# Patient Record
Sex: Male | Born: 1980
Health system: Midwestern US, Community
[De-identification: ages and names within clinical notes are randomized; demographics above are authoritative.]

---

## 2000-11-23 ENCOUNTER — Emergency Department (HOSPITAL_COMMUNITY): Admission: EM | Admit: 2000-11-23 | Discharge: 2000-11-23 | Payer: Self-pay | Admitting: Emergency Medicine

## 2000-11-23 ENCOUNTER — Encounter: Payer: Self-pay | Admitting: Emergency Medicine

## 2003-06-22 ENCOUNTER — Encounter: Admission: RE | Admit: 2003-06-22 | Discharge: 2003-06-22 | Payer: Self-pay | Admitting: Otolaryngology

## 2003-06-22 ENCOUNTER — Encounter: Payer: Self-pay | Admitting: Otolaryngology

## 2004-08-08 ENCOUNTER — Emergency Department (HOSPITAL_COMMUNITY): Admission: EM | Admit: 2004-08-08 | Discharge: 2004-08-09 | Payer: Self-pay | Admitting: Emergency Medicine

## 2005-07-09 ENCOUNTER — Emergency Department (HOSPITAL_COMMUNITY): Admission: EM | Admit: 2005-07-09 | Discharge: 2005-07-09 | Payer: Self-pay | Admitting: Emergency Medicine

## 2005-12-31 ENCOUNTER — Emergency Department (HOSPITAL_COMMUNITY): Admission: EM | Admit: 2005-12-31 | Discharge: 2005-12-31 | Payer: Self-pay | Admitting: Emergency Medicine

## 2006-11-14 IMAGING — CT CT ABDOMEN W/O CM
2 of 5 series · 17 of 46 positions shown, 19 images · IV contrast (agent unspecified)
Comparison: none

CLINICAL DATA: Back pain.
ABDOMEN CT WITHOUT CONTRAST:
TECHNIQUE: Multidetector CT imaging of the abdomen was performed following the standard protocol without IV contrast.
TECHNIQUE: Multidetector CT imaging of the pelvis was performed following the standard protocol without IV contrast.

[Series 4: abd_pel 2.0 b40f st · axial · 0.69mm/px · z∈[-443,-32]mm · 14 of 635 slices shown, 16 images]
[im 24/635  soft-tissue]
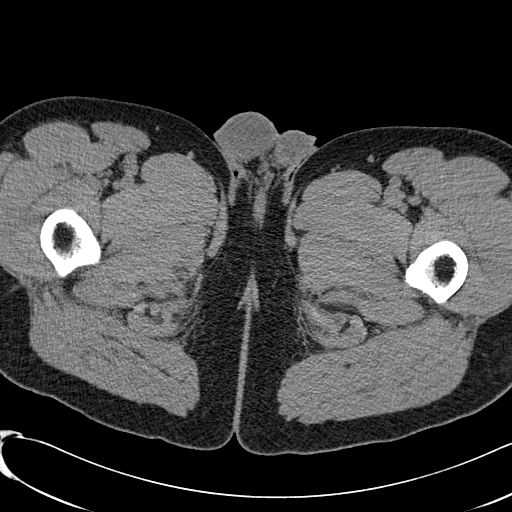
[im 24/635  bone]
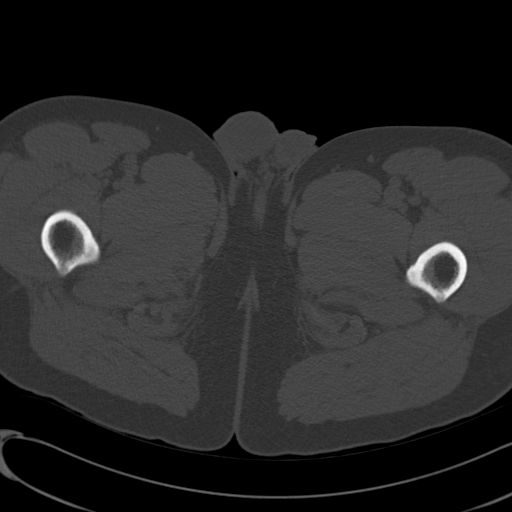
[im 71/635  soft-tissue]
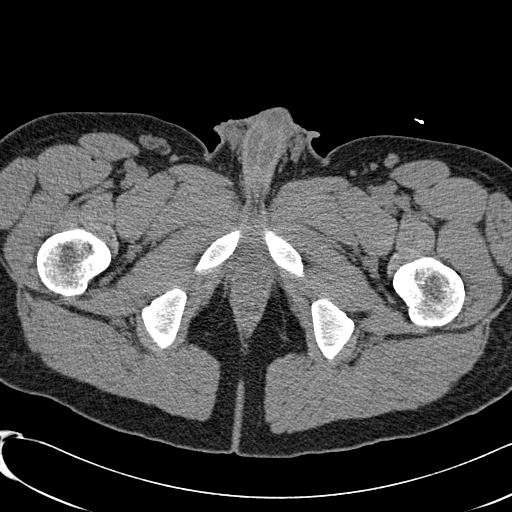
[im 118/635  soft-tissue]
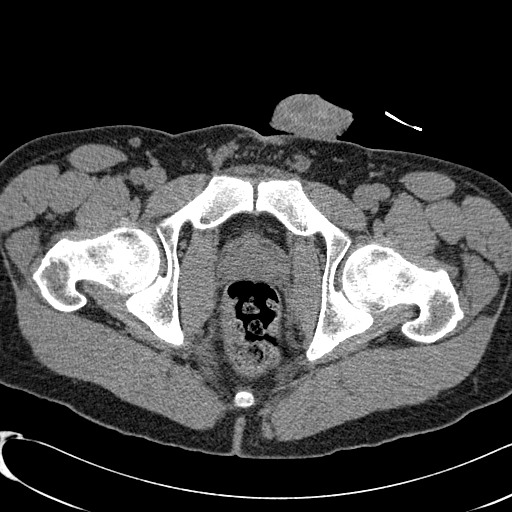
[im 165/635  soft-tissue]
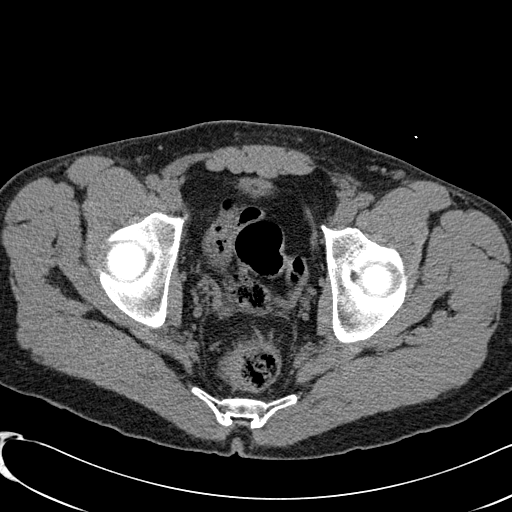
[im 212/635  soft-tissue]
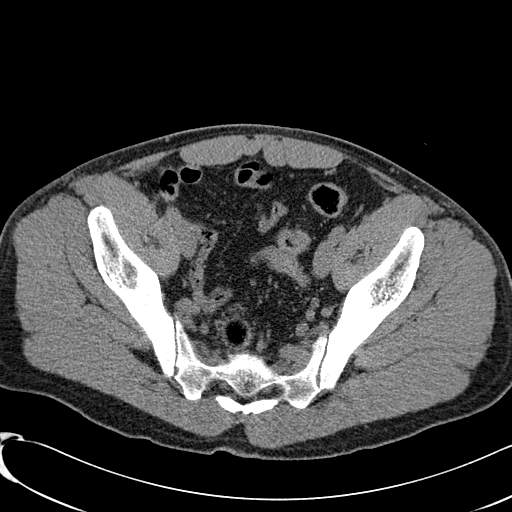
[im 259/635  soft-tissue]
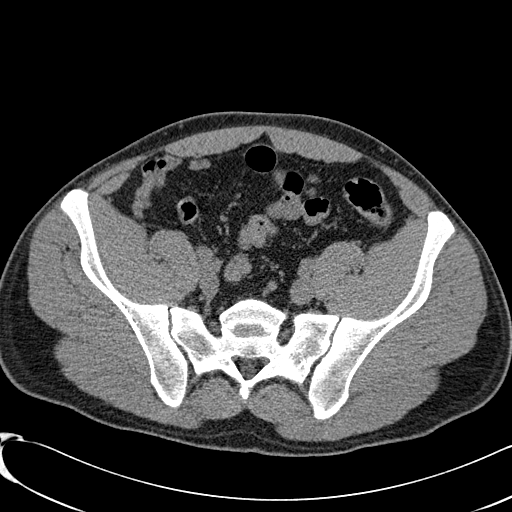
[im 306/635  soft-tissue]
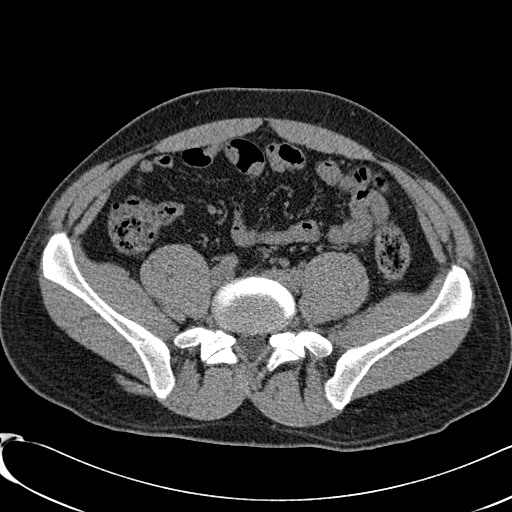
[im 329/635  soft-tissue]
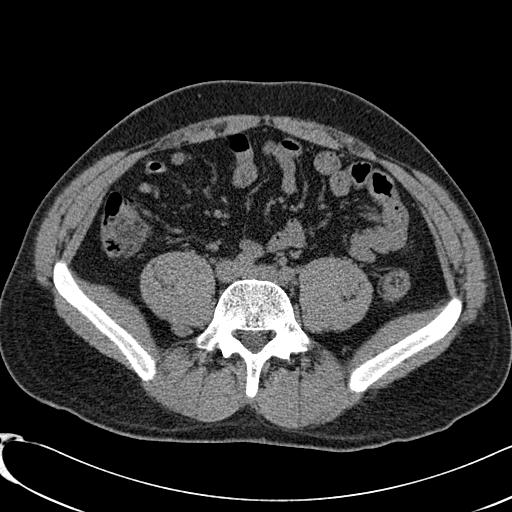
[im 376/635  soft-tissue]
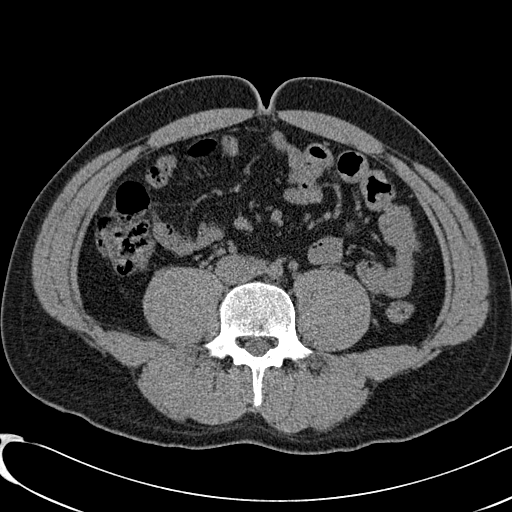
[im 376/635  bone]
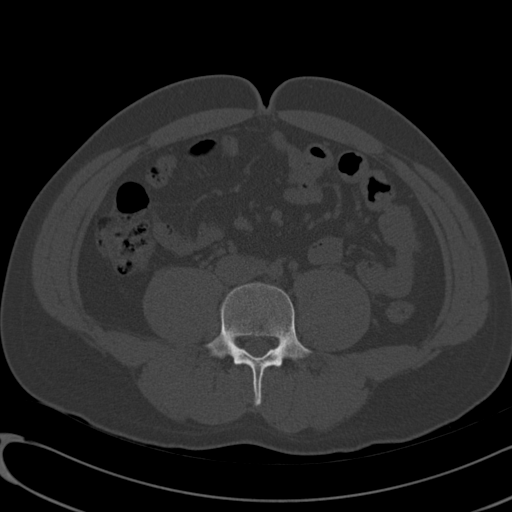
[im 423/635  soft-tissue]
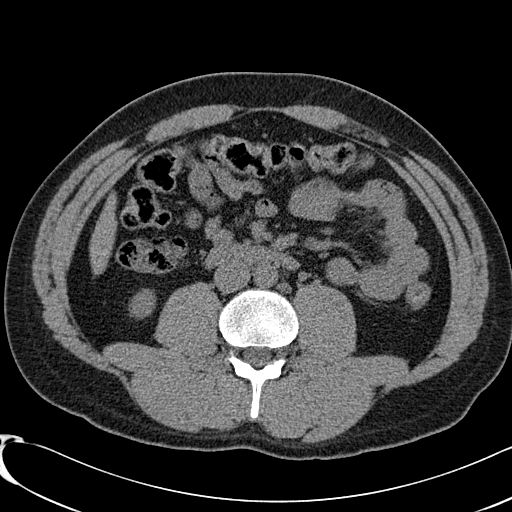
[im 470/635  soft-tissue]
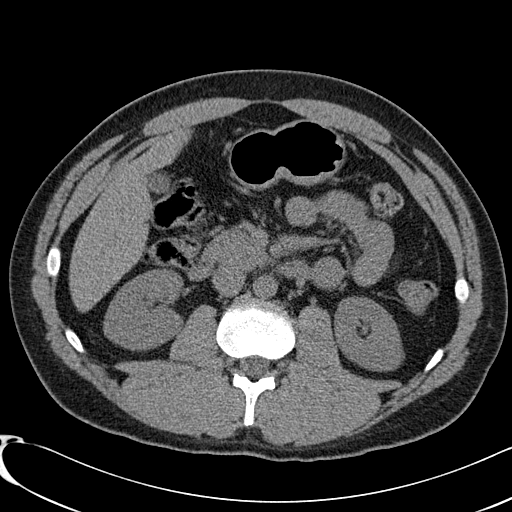
[im 517/635  soft-tissue]
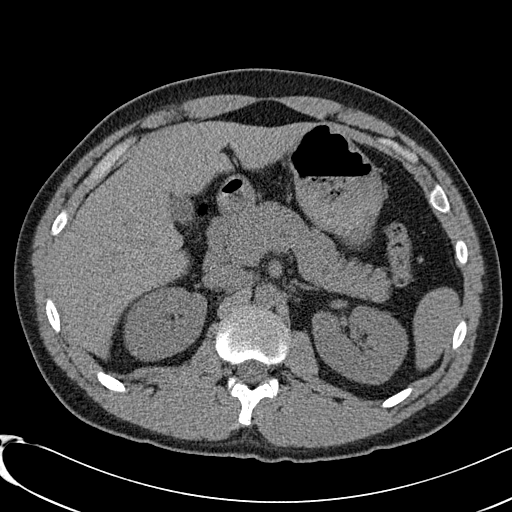
[im 564/635  soft-tissue]
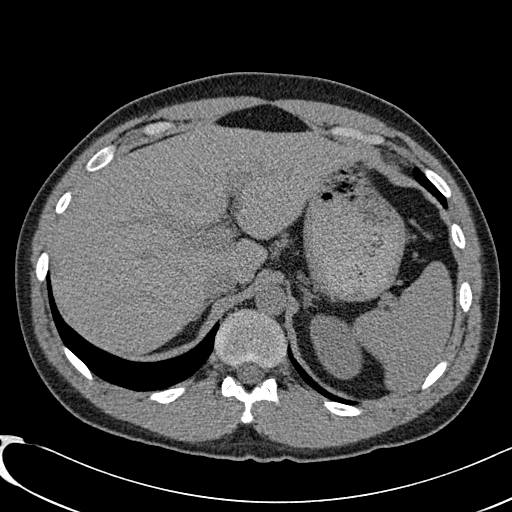
[im 611/635  soft-tissue]
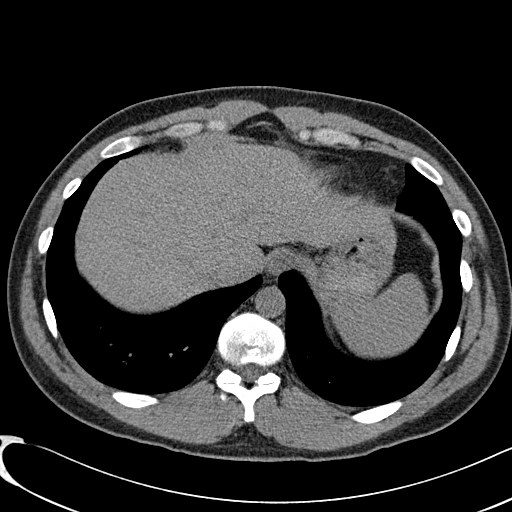

[Series 602: coronal · coronal · 0.87mm/px · 3 of 39 slices shown]
[im 13/39  soft-tissue]
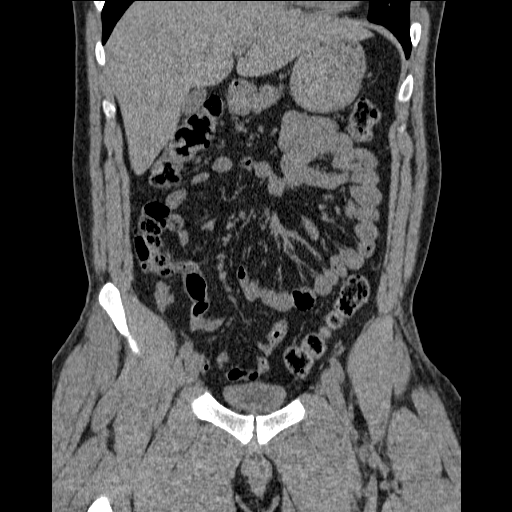
[im 17/39  soft-tissue]
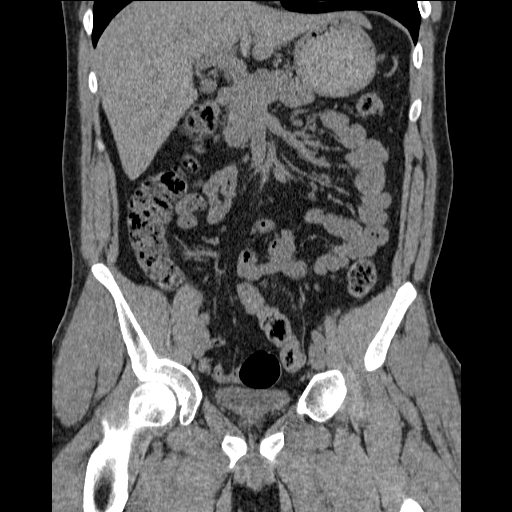
[im 22/39  soft-tissue]
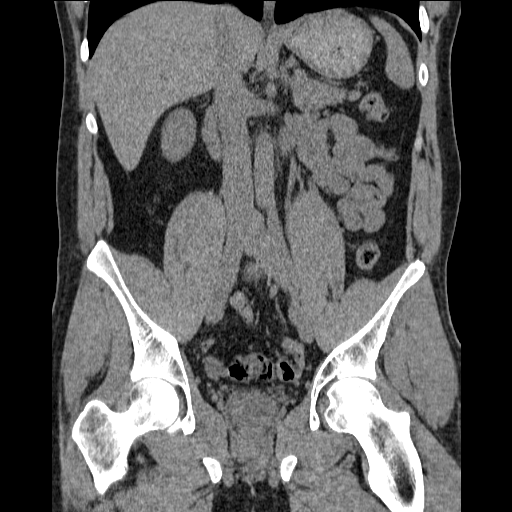

[17 of 46 positions shown; findings below may reference images not displayed]

FINDINGS: Fairly large amount of stool throughout the colon.  Negative for urinary tract obstruction.  No calculi are detected.
IMPRESSION: No acute abdominal process.  See comments above. 
PELVIS CT WITHOUT CONTRAST:
FINDINGS: Prostate gland, seminal vesicles and bladder region unremarkable.  Pelvic sidewalls well defined.  No evidence of urinary tract obstruction or calculi.  Large amount of stool in the rectum and there is a generous amount of stool in the colon.
IMPRESSION: No acute pelvic process detected.

## 2008-09-11 ENCOUNTER — Emergency Department (HOSPITAL_COMMUNITY): Admission: EM | Admit: 2008-09-11 | Discharge: 2008-09-12 | Payer: Self-pay

## 2010-12-25 ENCOUNTER — Ambulatory Visit (INDEPENDENT_AMBULATORY_CARE_PROVIDER_SITE_OTHER): Payer: 59 | Admitting: Family Medicine

## 2010-12-25 DIAGNOSIS — M25539 Pain in unspecified wrist: Secondary | ICD-10-CM

## 2010-12-25 LAB — COMPREHENSIVE METABOLIC PANEL
AST: 33 U/L (ref 0–37)
CO2: 22 mEq/L (ref 19–32)
Calcium: 9.4 mg/dL (ref 8.4–10.5)
Creatinine, Ser: 1.13 mg/dL (ref 0.4–1.5)
GFR calc Af Amer: >60 mL/min (ref >60)
GFR calc non Af Amer: >60 mL/min (ref >60)
Total Protein: 7 g/dL (ref 6.0–8.3)

## 2010-12-25 LAB — CBC
MCHC: 33.8 g/dL (ref 30.0–36.0)
MCV: 89.6 fL (ref 78.0–100.0)
Platelets: 260 10*3/uL (ref 150–400)
RBC: 4.97 MIL/uL (ref 4.22–5.81)
RDW: 12.7 % (ref 11.5–15.5)

## 2010-12-25 LAB — URINALYSIS, ROUTINE W REFLEX MICROSCOPIC
Bilirubin Urine: NEGATIVE
Nitrite: NEGATIVE
Specific Gravity, Urine: >1.046 — ABNORMAL HIGH (ref 1.005–1.030)
pH: 5.5 (ref 5.0–8.0)

## 2010-12-25 LAB — DIFFERENTIAL
Eosinophils Relative: 0 % (ref 0–5)
Lymphocytes Relative: 4 % — ABNORMAL LOW (ref 12–46)
Lymphs Abs: 0.4 10*3/uL — ABNORMAL LOW (ref 0.7–4.0)

## 2010-12-25 LAB — LIPASE, BLOOD: Lipase: 20 U/L (ref 11–59)

## 2014-11-01 ENCOUNTER — Ambulatory Visit (INDEPENDENT_AMBULATORY_CARE_PROVIDER_SITE_OTHER): Payer: 59 | Admitting: Family Medicine

## 2014-11-01 ENCOUNTER — Encounter: Payer: Self-pay | Admitting: Family Medicine

## 2014-11-01 VITALS — BP 124/83 | HR 80 | Temp 98.5°F | Resp 16 | Ht 73.5 in | Wt 223.0 lb

## 2014-11-01 DIAGNOSIS — R748 Abnormal levels of other serum enzymes: Secondary | ICD-10-CM

## 2014-11-01 DIAGNOSIS — Z13220 Encounter for screening for lipoid disorders: Secondary | ICD-10-CM

## 2014-11-01 DIAGNOSIS — B351 Tinea unguium: Secondary | ICD-10-CM

## 2014-11-01 DIAGNOSIS — Z7689 Persons encountering health services in other specified circumstances: Secondary | ICD-10-CM

## 2014-11-01 DIAGNOSIS — Z23 Encounter for immunization: Secondary | ICD-10-CM

## 2014-11-01 DIAGNOSIS — Z7189 Other specified counseling: Secondary | ICD-10-CM

## 2014-11-01 LAB — CBC WITH DIFFERENTIAL/PLATELET
BASOS ABS: 0.1 10*3/uL (ref 0.0–0.1)
Basophils Relative: 1 % (ref 0–1)
Eosinophils Absolute: 0.1 10*3/uL (ref 0.0–0.7)
Eosinophils Relative: 2 % (ref 0–5)
HEMATOCRIT: 43.4 % (ref 39.0–52.0)
HEMOGLOBIN: 14.9 g/dL (ref 13.0–17.0)
LYMPHS ABS: 1.7 10*3/uL (ref 0.7–4.0)
LYMPHS PCT: 33 % (ref 12–46)
MCH: 29.7 pg (ref 26.0–34.0)
MCHC: 34.3 g/dL (ref 30.0–36.0)
MCV: 86.6 fL (ref 78.0–100.0)
MPV: 9.2 fL (ref 8.6–12.4)
Monocytes Absolute: 0.4 10*3/uL (ref 0.1–1.0)
Monocytes Relative: 7 % (ref 3–12)
NEUTROS PCT: 57 % (ref 43–77)
Neutro Abs: 3 10*3/uL (ref 1.7–7.7)
PLATELETS: 315 10*3/uL (ref 150–400)
RBC: 5.01 MIL/uL (ref 4.22–5.81)
RDW: 13.2 % (ref 11.5–15.5)
WBC: 5.2 10*3/uL (ref 4.0–10.5)

## 2014-11-01 LAB — LIPID PANEL
CHOLESTEROL: 165 mg/dL (ref 0–200)
HDL: 56 mg/dL (ref >=40)
LDL Cholesterol: 83 mg/dL (ref 0–99)
Total CHOL/HDL Ratio: 2.9 Ratio
Triglycerides: 132 mg/dL (ref <150)
VLDL: 26 mg/dL (ref 0–40)

## 2014-11-01 LAB — COMPREHENSIVE METABOLIC PANEL
ALBUMIN: 4.6 g/dL (ref 3.5–5.2)
ALT: 71 U/L — AB (ref 0–53)
AST: 43 U/L — AB (ref 0–37)
Alkaline Phosphatase: 76 U/L (ref 39–117)
BUN: 16 mg/dL (ref 6–23)
CO2: 27 meq/L (ref 19–32)
CREATININE: 1.03 mg/dL (ref 0.50–1.35)
Calcium: 10 mg/dL (ref 8.4–10.5)
Chloride: 103 mEq/L (ref 96–112)
Glucose, Bld: 79 mg/dL (ref 70–99)
POTASSIUM: 4.3 meq/L (ref 3.5–5.3)
SODIUM: 138 meq/L (ref 135–145)
TOTAL PROTEIN: 7.5 g/dL (ref 6.0–8.3)
Total Bilirubin: 0.5 mg/dL (ref 0.2–1.2)

## 2014-11-01 MED ORDER — EFINACONAZOLE 10 % EX SOLN: CUTANEOUS | Status: DC

## 2014-11-01 NOTE — Patient Instructions (Signed)
Great to meet you today- take care and I will be in touch with your labs asap If the Jublia nail treatment is too expensive and you want to try lamisil again just let me know.   You got your tdap vaccine today (prevents tetanus and whooping cough) as well as your flu shot.   Please set up your mychart account for most efficient communication

## 2014-11-01 NOTE — Addendum Note (Signed)
Addended by: Abbe AmsterdamOPLAND, JESSICA C on: 11/01/2014 07:52 PM   Modules accepted: Orders

## 2014-11-01 NOTE — Progress Notes (Signed)
[facility] [address], [place] [address] [phone]  Date:  11/01/2014   Name:  Gary Harrell   DOB:  09/01/1981   MRN:  [id number]  PCP:  Carollee HerterLALONDE,JOHN CHARLES, MD    Chief Complaint: Establish Care   History of Present Illness:  Gary Harrell is a 34 y.o. very pleasant male patient who presents with the following:  Here today to establish care with me.  He is a new patient.  He does have some toenail fungus.  He used lamisil a copule of years age and it got better but then came back- he has sx in both big toenails He and his wife are expecting a baby girl this summer- this will be his first child He had a nutrigrain bar this am otherwise nothing to eat.  He will see us in May for a CPE but we can do labs from him today He is a non- smoker, rarely drinks alcohol, and is generally in good health   There are no active problems to display for this patient.   No past medical history on file.  No past surgical history on file.  History  Substance Use Topics  . Smoking status: Never Smoker   . Smokeless tobacco: Not on file  . Alcohol Use: Not on file    No family history on file.  No Known Allergies  Medication list has been reviewed and updated.  No current outpatient prescriptions on file prior to visit.   No current facility-administered medications on file prior to visit.    Review of Systems:  As per HPI- otherwise negative.   Physical Examination: Filed Vitals:   11/01/14 1034  BP: 124/83  Pulse: 80  Temp: 98.5 F (36.9 C)  Resp: 16   Filed Vitals:   11/01/14 1034  Height: 6' 1.5" (1.867 m)  Weight: 223 lb (101.152 kg)   Body mass index is 29.02 kg/(m^2). Ideal Body Weight: Weight in (lb) to have BMI = 25: 191.7  GEN: WDWN, NAD, Non-toxic, A & O x 3, looks well HEENT: Atraumatic, Normocephalic. Neck supple. No masses, No LAD. Ears and Nose: No external deformity. CV: RRR, No M/G/R. No JVD. No thrill. No extra heart  sounds. PULM: CTA B, no wheezes, crackles, rhonchi. No retractions. No resp. distress. No accessory muscle use. EXTR: No c/c/e NEURO Normal gait.  PSYCH: Normally interactive. Conversant. Not depressed or anxious appearing.  Calm demeanor.   Thickening and yellowed appearance of bilateral great toenails- consistent with onychomycosis.   Assessment and Plan: Establishing care with new doctor, encounter for  Need for prophylactic vaccination and inoculation against influenza - Plan: Flu Vaccine QUAD 36+ mos IM, Tdap vaccine greater than or equal to 7yo IM  Onychomycosis of toenail - Plan: Efinaconazole 10 % SOLN, CBC with Differential/Platelet, Comprehensive metabolic panel  Screening for hyperlipidemia - Plan: Lipid panel  Establish care- flu shot and tdap today Labs pending as above He would like to try the new topical efinaconazole for his fungal nails- however if too expensive he will let me know and we can do lamisil instead. He plans to see me for a CPE in a few months   Signed Abbe AmsterdamJessica Copland, MD

## 2015-01-17 ENCOUNTER — Encounter: Payer: Self-pay | Admitting: Family Medicine

## 2015-02-07 ENCOUNTER — Telehealth: Payer: Self-pay

## 2015-02-07 NOTE — Telephone Encounter (Signed)
PA needed for Jublia topical nail antifungal sol. Pt has tried Lamisil in the past. [facility] req stated that pt has already purchased this med and needs PA for reimbursement. Completed on covermymeds. Pending.

## 2015-02-07 NOTE — Telephone Encounter (Signed)
PA was denied because pt has not had a failure to either itraconazole or ciclopirox. Dr Patsy Lageropland, do you want to Rx one of these for pt?

## 2015-02-09 ENCOUNTER — Encounter: Payer: Self-pay | Admitting: Family Medicine

## 2015-02-09 NOTE — Telephone Encounter (Signed)
Called and LMOM- we are having a hard time getting the Jublia covered.  If he would like we can try another medication if his sx persist- however if his sx are now resolved I hate to use another medication Asked him to please call or send me a mychart message with his thoughts on this

## 2015-02-24 ENCOUNTER — Encounter: Payer: Self-pay | Admitting: Family Medicine

## 2015-04-05 ENCOUNTER — Encounter: Payer: Self-pay | Admitting: Family Medicine

## 2015-04-06 ENCOUNTER — Other Ambulatory Visit: Payer: Self-pay | Admitting: Family Medicine

## 2015-04-06 DIAGNOSIS — B351 Tinea unguium: Secondary | ICD-10-CM

## 2015-04-06 MED ORDER — TERBINAFINE HCL 250 MG PO TABS
250.0000 mg | ORAL_TABLET | Freq: Every day | ORAL | Status: DC
Start: 2015-04-06 — End: 2017-03-19

## 2015-05-16 ENCOUNTER — Encounter: Payer: Self-pay | Admitting: Family Medicine

## 2015-05-16 DIAGNOSIS — R748 Abnormal levels of other serum enzymes: Secondary | ICD-10-CM

## 2015-08-08 ENCOUNTER — Encounter: Payer: Self-pay | Admitting: Family Medicine

## 2017-03-19 ENCOUNTER — Ambulatory Visit (INDEPENDENT_AMBULATORY_CARE_PROVIDER_SITE_OTHER): Payer: BLUE CROSS/BLUE SHIELD | Admitting: Urgent Care

## 2017-03-19 ENCOUNTER — Encounter: Payer: Self-pay | Admitting: Urgent Care

## 2017-03-19 VITALS — BP 132/81 | HR 73 | Temp 97.6°F | Resp 16 | Ht 73.5 in | Wt 220.5 lb

## 2017-03-19 DIAGNOSIS — H9203 Otalgia, bilateral: Secondary | ICD-10-CM | POA: Diagnosis not present

## 2017-03-19 DIAGNOSIS — H6690 Otitis media, unspecified, unspecified ear: Secondary | ICD-10-CM

## 2017-03-19 MED ORDER — AMOXICILLIN 875 MG PO TABS: 875.0000 mg | ORAL_TABLET | Freq: Two times a day (BID) | ORAL | 0 refills | Status: AC

## 2017-03-19 NOTE — Progress Notes (Signed)
  MRN: [id number] DOB: 05/25/1981  Subjective:   Gary Harrell is a 36 y.o. male presenting for chief complaint of Ear Problem (x2 days; drainage/pain/hearing loss; states its both ears)  Reports 2 day history of worsening bilateral ear pain, R>L, decreased hearing, sinus headache. Pain is throbbing sensation. Symptoms started last week with fever (highest 101F), runny nose, clear productive cough. All symptoms have improved except his ears. His daughter is currently undergoing an ear infection. Denies tinnitus, dizziness, ear popping, sinus pain.   Gary Harrell is not currently taking any medications. Also has No Known Allergies. Gary Harrell denies past medical and surgical history.   Objective:   Vitals: BP 132/81   Pulse 73   Temp 97.6 F (36.4 C) (Oral)   Resp 16   Ht 6' 1.5" (1.867 m)   Wt 220 lb 8 oz (100 kg)   SpO2 95%   BMI 28.70 kg/m   Physical Exam  Constitutional: He is oriented to person, place, and time. He appears well-developed and well-nourished.  HENT:  Right TM erythematous with air fluid level. Bilateral tragus tenderness, R>L. Left TM flat but without erythema, effusion. Both TM's intact. Nasal turbinates pink and moist, nasal passages patent. No sinus tenderness. Oropharynx with thick streaks of post-nasal drainage, mucous membranes moist.  Eyes: Right eye exhibits no discharge. Left eye exhibits no discharge.  Neck: Normal range of motion. Neck supple.  Cardiovascular: Normal rate.   Pulmonary/Chest: Effort normal.  Lymphadenopathy:    He has no cervical adenopathy.  Neurological: He is alert and oriented to person, place, and time.  Skin: Skin is warm and dry.  Psychiatric: He has a normal mood and affect.   Assessment and Plan :   1. Acute otitis media, unspecified otitis media type 2. Acute ear pain, bilateral - Start amoxicillin to cover for infectious process. Patient is to use ibuprofen +/- APAP for ear pain. Return-to-clinic precautions discussed, patient  verbalized understanding.   Wallis BambergMario Terrick Allred, PA-C [facility] [phone] 03/19/2017  9:42 AM

## 2017-03-19 NOTE — Patient Instructions (Addendum)
Otitis Media, Adult Otitis media occurs when there is inflammation and fluid in the middle ear. Your middle ear is a part of the ear that contains bones for hearing as well as air that helps send sounds to your brain. What are the causes? This condition is caused by a blockage in the eustachian tube. This tube drains fluid from the ear to the back of the nose (nasopharynx). A blockage in this tube can be caused by an object or by swelling (edema) in the tube. Problems that can cause a blockage include:  A cold or other upper respiratory infection.  Allergies.  An irritant, such as tobacco smoke.  Enlarged adenoids. The adenoids are areas of soft tissue located high in the back of the throat, behind the nose and the roof of the mouth.  A mass in the nasopharynx.  Damage to the ear caused by pressure changes (barotrauma).  What are the signs or symptoms? Symptoms of this condition include:  Ear pain.  A fever.  Decreased hearing.  A headache.  Tiredness (lethargy).  Fluid leaking from the ear.  Ringing in the ear.  How is this diagnosed? This condition is diagnosed with a physical exam. During the exam your health care provider will use an instrument called an otoscope to look into your ear and check for redness, swelling, and fluid. He or she will also ask about your symptoms. Your health care provider may also order tests, such as:  A test to check the movement of the eardrum (pneumatic otoscopy). This test is done by squeezing a small amount of air into the ear.  A test that changes air pressure in the middle ear to check how well the eardrum moves and whether the eustachian tube is working (tympanogram).  How is this treated? This condition usually goes away on its own within 3-5 days. But if the condition is caused by a bacteria infection and does not go away own its own, or keeps coming back, your health care provider may:  Prescribe antibiotic medicines to treat the  infection.  Prescribe or recommend medicines to control pain.  Follow these instructions at home:  Take over-the-counter and prescription medicines only as told by your health care provider.  If you were prescribed an antibiotic medicine, take it as told by your health care provider. Do not stop taking the antibiotic even if you start to feel better.  Keep all follow-up visits as told by your health care provider. This is important. Contact a health care provider if:  You have bleeding from your nose.  There is a lump on your neck.  You are not getting better in 5 days.  You feel worse instead of better. Get help right away if:  You have severe pain that is not controlled with medicine.  You have swelling, redness, or pain around your ear.  You have stiffness in your neck.  A part of your face is paralyzed.  The bone behind your ear (mastoid) is tender when you touch it.  You develop a severe headache. Summary  Otitis media is redness, soreness, and swelling of the middle ear.  This condition usually goes away on its own within 3-5 days.  If the problem does not go away in 3-5 days, your health care provider may prescribe or recommend medicines to treat your symptoms.  If you were prescribed an antibiotic medicine, take it as told by your health care provider. This information is not intended to replace advice given   to you by your health care provider. Make sure you discuss any questions you have with your health care provider. Document Released: 06/01/2004 Document Revised: 08/17/2016 Document Reviewed: 08/17/2016 Elsevier Interactive Patient Education  2017 [employer]     IF you received an x-ray today, you will receive an invoice from [facility]. Please contact [facility] at [phone] with questions or concerns regarding your invoice.   IF you received labwork today, you will receive an invoice from [facility]. Please contact [facility] at  [phone] with questions or concerns regarding your invoice.   Our billing staff will not be able to assist you with questions regarding bills from these companies.  You will be contacted with the lab results as soon as they are available. The fastest way to get your results is to activate your My Chart account. Instructions are located on the last page of this paperwork. If you have not heard from us regarding the results in 2 weeks, please contact this office.      

## 2018-11-10 ENCOUNTER — Encounter: Attending: Family Medicine

## 2022-05-28 ENCOUNTER — Ambulatory Visit
Admit: 2022-05-28 | Discharge: 2022-05-28 | Payer: PRIVATE HEALTH INSURANCE | Attending: Family Medicine | Primary: Family Medicine

## 2022-05-28 DIAGNOSIS — Z0000 Encounter for general adult medical examination without abnormal findings: Secondary | ICD-10-CM

## 2022-05-28 LAB — CBC WITH AUTO DIFFERENTIAL
Absolute Immature Granulocyte: 0 10*3/uL (ref 0.0–0.5)
Basophils %: 1 % (ref 0.0–2.0)
Basophils Absolute: 0 10*3/uL (ref 0.0–0.2)
Eosinophils %: 1 % (ref 0.5–7.8)
Eosinophils Absolute: 0.1 10*3/uL (ref 0.0–0.8)
Hematocrit: 46.5 % (ref 41.1–50.3)
Hemoglobin: 14.9 g/dL (ref 13.6–17.2)
Immature Granulocytes: 0 % (ref 0.0–5.0)
Lymphocytes %: 30 % (ref 13–44)
Lymphocytes Absolute: 1.6 10*3/uL (ref 0.5–4.6)
MCH: 30 PG (ref 26.1–32.9)
MCHC: 32 g/dL (ref 31.4–35.0)
MCV: 93.8 FL (ref 82–102)
MPV: 9.7 FL (ref 9.4–12.3)
Monocytes %: 7 % (ref 4.0–12.0)
Monocytes Absolute: 0.4 10*3/uL (ref 0.1–1.3)
Neutrophils %: 61 % (ref 43–78)
Neutrophils Absolute: 3.2 10*3/uL (ref 1.7–8.2)
Platelets: 299 10*3/uL (ref 150–450)
RBC: 4.96 M/uL (ref 4.23–5.6)
RDW: 12.6 % (ref 11.9–14.6)
WBC: 5.2 10*3/uL (ref 4.3–11.1)
nRBC: 0 10*3/uL (ref 0.0–0.2)

## 2022-05-28 LAB — AMB POC URINALYSIS DIP STICK AUTO W/O MICRO
Bilirubin, Urine, POC: NEGATIVE
Blood, Urine, POC: NEGATIVE
Glucose, Urine, POC: NEGATIVE
Ketones, Urine, POC: NEGATIVE
Leukocyte Esterase, Urine, POC: NEGATIVE
Nitrite, Urine, POC: NEGATIVE
Protein, Urine, POC: NEGATIVE
Specific Gravity, Urine, POC: 1.015 (ref 1.001–1.035)
Urobilinogen, POC: 0.2
pH, Urine, POC: 7 (ref 4.6–8.0)

## 2022-05-28 LAB — HIV 1/2 AG/AB, 4TH GENERATION,W RFLX CONFIRM: HIV 1/2 Interp: NONREACTIVE

## 2022-05-28 LAB — HEPATITIS C ANTIBODY: Hepatitis C Ab: NONREACTIVE

## 2022-05-28 NOTE — Progress Notes (Signed)
PROGRESS NOTE    SUBJECTIVE:   Donald Holt is a 41 y.o. male seen for a follow up visit regarding   Chief Complaint   Patient presents with    New Patient     Establish care        HPI:  New patient, wanting to establish primary care here.  Healthy 41 year old male with no chronic medical illnesses, here today for a checkup.  He is feeling well, voices no complaints today.  He is taking no medications.         Reviewed and updated this visit by provider:           Review of Systems   Respiratory: Negative.     Cardiovascular: Negative.           OBJECTIVE:  Vitals:    05/28/22 1021   BP: 132/86   Site: Left Upper Arm   Cuff Size: Medium Adult   Pulse: 71   SpO2: 96%   Weight: 229 lb 6.4 oz (104.1 kg)   Height: 6\' 2"  (1.88 m)        Physical Exam   General: Alert and oriented x3, well-appearing  Neck: No adenopathy, thyromegaly or thyroid nodules  Pulmonary: Normal effort, good airflow, no rales or rhonchi  CVS: Regular rate and rhythm, normal S1, S2, no S3 or S4, no murmurs; no carotid bruits, 2+ pedal pulses      Medical problems and test results were reviewed with the patient today.     No results found for this or any previous visit (from the past 672 hour(s)).    No results found for any visits on 05/28/22.     ASSESSMENT and PLAN    1. Healthcare maintenance  2. Need for influenza vaccination  -     Influenza, FLUCELVAX, (age 29 mo+), IM, Preservative Free, 0.5 mL         No follow-ups on file.       Ernestina Patches, MD

## 2022-05-29 LAB — LIPID PANEL
Chol/HDL Ratio: 3.3
Cholesterol, Total: 200 MG/DL — ABNORMAL HIGH (ref <200)
HDL: 61 MG/DL — ABNORMAL HIGH (ref 40–60)
LDL Calculated: 110.6 MG/DL — ABNORMAL HIGH (ref <100)
Triglycerides: 142 MG/DL (ref 35–150)
VLDL Cholesterol Calculated: 28.4 MG/DL — ABNORMAL HIGH (ref 6.0–23.0)

## 2022-05-29 LAB — COMPREHENSIVE METABOLIC PANEL
ALT: 59 U/L (ref 12–65)
AST: 32 U/L (ref 15–37)
Albumin/Globulin Ratio: 1.3 (ref 0.4–1.6)
Albumin: 4.3 g/dL (ref 3.5–5.0)
Alk Phosphatase: 88 U/L (ref 50–136)
Anion Gap: 6 mmol/L (ref 2–11)
BUN: 14 MG/DL (ref 6–23)
CO2: 29 mmol/L (ref 21–32)
Calcium: 9.8 MG/DL (ref 8.3–10.4)
Chloride: 108 mmol/L (ref 101–110)
Creatinine: 1.1 MG/DL (ref 0.8–1.5)
Est, Glom Filt Rate: >60 mL/min/{1.73_m2} (ref >60)
Globulin: 3.3 g/dL (ref 2.8–4.5)
Glucose: 98 mg/dL (ref 65–100)
Potassium: 4.6 mmol/L (ref 3.5–5.1)
Sodium: 143 mmol/L (ref 133–143)
Total Bilirubin: 0.6 MG/DL (ref 0.2–1.1)
Total Protein: 7.6 g/dL (ref 6.3–8.2)

## 2022-05-29 LAB — TSH: TSH, 3RD GENERATION: 2.84 u[IU]/mL (ref 0.358–3.740)
